# Patient Record
Sex: Female | Born: 1970 | Race: White | Hispanic: No | Smoking: Never smoker
Health system: Southern US, Community
[De-identification: ages and names within clinical notes are randomized; demographics above are authoritative.]

## PROBLEM LIST (undated history)

## (undated) DIAGNOSIS — F329 Major depressive disorder, single episode, unspecified: Secondary | ICD-10-CM

## (undated) DIAGNOSIS — F419 Anxiety disorder, unspecified: Secondary | ICD-10-CM

## (undated) DIAGNOSIS — F32A Depression, unspecified: Secondary | ICD-10-CM

## (undated) HISTORY — PX: ABDOMINAL HYSTERECTOMY: SHX81

## (undated) HISTORY — PX: CHOLECYSTECTOMY: SHX55

## (undated) HISTORY — PX: TUBAL LIGATION: SHX77

## (undated) HISTORY — PX: ABLATION ON ENDOMETRIOSIS: SHX5787

## (undated) HISTORY — PX: TONSILLECTOMY: SUR1361

## (undated) HISTORY — PX: LITHOTRIPSY: SUR834

---

## 2014-07-03 ENCOUNTER — Emergency Department (HOSPITAL_COMMUNITY)
Admission: EM | Admit: 2014-07-03 | Discharge: 2014-07-03 | Disposition: A | Discharge status: Home / Self Care | Payer: Medicaid Other | Attending: Emergency Medicine | Admitting: Emergency Medicine

## 2014-07-03 ENCOUNTER — Encounter (HOSPITAL_COMMUNITY): Payer: Self-pay | Admitting: Emergency Medicine

## 2014-07-03 DIAGNOSIS — Y929 Unspecified place or not applicable: Secondary | ICD-10-CM | POA: Insufficient documentation

## 2014-07-03 DIAGNOSIS — R21 Rash and other nonspecific skin eruption: Secondary | ICD-10-CM | POA: Insufficient documentation

## 2014-07-03 DIAGNOSIS — G8921 Chronic pain due to trauma: Secondary | ICD-10-CM | POA: Insufficient documentation

## 2014-07-03 DIAGNOSIS — M5430 Sciatica, unspecified side: Secondary | ICD-10-CM | POA: Insufficient documentation

## 2014-07-03 DIAGNOSIS — M62838 Other muscle spasm: Secondary | ICD-10-CM | POA: Insufficient documentation

## 2014-07-03 DIAGNOSIS — Z79899 Other long term (current) drug therapy: Secondary | ICD-10-CM | POA: Diagnosis not present

## 2014-07-03 DIAGNOSIS — M5442 Lumbago with sciatica, left side: Secondary | ICD-10-CM

## 2014-07-03 DIAGNOSIS — F419 Anxiety disorder, unspecified: Secondary | ICD-10-CM

## 2014-07-03 DIAGNOSIS — Y939 Activity, unspecified: Secondary | ICD-10-CM | POA: Insufficient documentation

## 2014-07-03 DIAGNOSIS — M62830 Muscle spasm of back: Secondary | ICD-10-CM

## 2014-07-03 DIAGNOSIS — W57XXXA Bitten or stung by nonvenomous insect and other nonvenomous arthropods, initial encounter: Secondary | ICD-10-CM

## 2014-07-03 DIAGNOSIS — F411 Generalized anxiety disorder: Secondary | ICD-10-CM | POA: Insufficient documentation

## 2014-07-03 HISTORY — DX: Anxiety disorder, unspecified: F41.9

## 2014-07-03 MED ORDER — NAPROXEN 500 MG PO TABS: 500.0000 mg | ORAL_TABLET | Freq: Two times a day (BID) | ORAL | Status: DC | PRN

## 2014-07-03 MED ORDER — HYDROCODONE-ACETAMINOPHEN 5-325 MG PO TABS: 1.0000 | ORAL_TABLET | Freq: Four times a day (QID) | ORAL | Status: AC | PRN

## 2014-07-03 MED ORDER — TRIAMCINOLONE ACETONIDE 0.1 % EX CREA: 1.0000 "application " | TOPICAL_CREAM | Freq: Three times a day (TID) | CUTANEOUS | Status: DC | PRN

## 2014-07-03 MED ORDER — ALPRAZOLAM 1 MG PO TABS: 1.0000 mg | ORAL_TABLET | Freq: Three times a day (TID) | ORAL | Status: DC | PRN

## 2014-07-03 NOTE — Discharge Instructions (Signed)
You have been given a small supply of xanax for the time being but you need to see a psychiatric or behavioral medicine doctor as soon as possible, using the list below to find a primary doctor. Your rash does not appear infected, use the triamcinolone cream as directed for itching. Your back pain should be treated with medicines such as ibuprofen or aleve and this back pain should get better over the next 2 weeks.  However if you develop severe or worsening pain, low back pain with fever, numbness, weakness or inability to walk or urinate, you should return to the ER immediately.  Please follow up with your doctor this week for a recheck if still having symptoms.  Low back pain is discomfort in the lower back that may be due to injuries to muscles and ligaments around the spine.  Occasionally, it may be caused by a a problem to a part of the spine called a disc.  The pain may last several days or a week;  However, most patients get completely well in 4 weeks.  Self - care:  The application of heat can help soothe the pain.  Maintaining your daily activities, including walking, is encourged, as it will help you get better faster than just staying in bed. Perform gentle stretching as discussed. Drink plenty of fluids.  Medications are also useful to help with pain control.  A commonly prescribed medications includes norco.  Do not drive or operate machinery while taking this medication.  Non steroidal anti inflammatory medications including Ibuprofen and naproxen;  These medications help both pain and swelling and are very useful in treating back pain.  They should be taken with food, as they can cause stomach upset, and more seriously, stomach bleeding.    SEEK IMMEDIATE MEDICAL ATTENTION IF: New numbness, tingling, weakness, or problem with the use of your arms or legs.  Severe back pain not relieved with medications.  Difficulty with or loss of control of your bowel or bladder control.  Increasing  pain in any areas of the body (such as chest or abdominal pain).  Shortness of breath, dizziness or fainting.  Nausea (feeling sick to your stomach), vomiting, fever, or sweats.  You will need to follow up with  Your primary healthcare provider in 1-2 weeks for reassessment.  If you do not have a doctor see the list below.  Back Pain, Adult Back pain is very common. The pain often gets better over time. The cause of back pain is usually not dangerous. Most people can learn to manage their back pain on their own.  HOME CARE   Stay active. Start with short walks on flat ground if you can. Try to walk farther each day.  Do not sit, drive, or stand in one place for more than 30 minutes. Do not stay in bed.  Do not avoid exercise or work. Activity can help your back heal faster.  Be careful when you bend or lift an object. Bend at your knees, keep the object close to you, and do not twist.  Sleep on a firm mattress. Lie on your side, and bend your knees. If you lie on your back, put a pillow under your knees.  Only take medicines as told by your doctor.  Put ice on the injured area.  Put ice in a plastic bag.  Place a towel between your skin and the bag.  Leave the ice on for 15-20 minutes, 03-04 times a day for the first 2  to 3 days. After that, you can switch between ice and heat packs.  Ask your doctor about back exercises or massage.  Avoid feeling anxious or stressed. Find good ways to deal with stress, such as exercise. GET HELP RIGHT AWAY IF:   Your pain does not go away with rest or medicine.  Your pain does not go away in 1 week.  You have new problems.  You do not feel well.  The pain spreads into your legs.  You cannot control when you poop (bowel movement) or pee (urinate).  Your arms or legs feel weak or lose feeling (numbness).  You feel sick to your stomach (nauseous) or throw up (vomit).  You have belly (abdominal) pain.  You feel like you may pass  out (faint). MAKE SURE YOU:   Understand these instructions.  Will watch your condition.  Will get help right away if you are not doing well or get worse. Document Released: 04/27/2008 Document Revised: 02/01/2012 Document Reviewed: 03/13/2014 [facility] Patient Information 2015 [place], [place]. This information is not intended to replace advice given to you by your health care provider. Make sure you discuss any questions you have with your health care provider.  Insect Bite Mosquitoes, flies, fleas, bedbugs, and other insects can bite. Insect bites are different from insect stings. The bite may be red, puffy (swollen), and itchy for 2 to 4 days. Most bites get better on their own. HOME CARE   Do not scratch the bite.  Keep the bite clean and dry. Wash the bite with soap and water.  Put ice on the bite.  Put ice in a plastic bag.  Place a towel between your skin and the bag.  Leave the ice on for 20 minutes, 4 times a day. Do this for the first 2 to 3 days, or as told by your doctor.  You may use medicated lotions or creams to lessen itching as told by your doctor.  Only take medicines as told by your doctor.  If you are given medicines (antibiotics), take them as told. Finish them even if you start to feel better. You may need a tetanus shot if:  You cannot remember when you had your last tetanus shot.  You have never had a tetanus shot.  The injury broke your skin. If you need a tetanus shot and you choose not to have one, you may get tetanus. Sickness from tetanus can be serious. GET HELP RIGHT AWAY IF:   You have more pain, redness, or puffiness.  You see a red line on the skin coming from the bite.  You have a fever.  You have joint pain.  You have a headache or neck pain.  You feel weak.  You have a rash.  You have chest pain, or you are short of breath.  You have belly (abdominal) pain.  You feel sick to your stomach (nauseous) or throw up  (vomit).  You feel very tired or sleepy. MAKE SURE YOU:   Understand these instructions.  Will watch your condition.  Will get help right away if you are not doing well or get worse. Document Released: 11/06/2000 Document Revised: 02/01/2012 Document Reviewed: 06/10/2011 [facility] Patient Information 2015 [place], [place]. This information is not intended to replace advice given to you by your health care provider. Make sure you discuss any questions you have with your health care provider.   Emergency Department Resource Guide 1) Find a Doctor and Pay Out of Pocket Although you won't  have to find out who is covered by your insurance plan, it is a good idea to ask around and get recommendations. You will then need to call the office and see if the doctor you have chosen will accept you as a new patient and what types of options they offer for patients who are self-pay. Some doctors offer discounts or will set up payment plans for their patients who do not have insurance, but you will need to ask so you aren't surprised when you get to your appointment.  2) Contact Your Local Health Department Not all health departments have doctors that can see patients for sick visits, but many do, so it is worth a call to see if yours does. If you don't know where your local health department is, you can check in your phone book. The CDC also has a tool to help you locate your state's health department, and many state websites also have listings of all of their local health departments.  3) Find a Walk-in Clinic If your illness is not likely to be very severe or complicated, you may want to try a walk in clinic. These are popping up all over the country in pharmacies, drugstores, and shopping centers. They're usually staffed by nurse practitioners or physician assistants that have been trained to treat common illnesses and complaints. They're usually fairly quick and inexpensive. However, if you have serious  medical issues or chronic medical problems, these are probably not your best option.  No Primary Care Doctor: - Call Health Connect at  [phone] - they can help you locate a primary care doctor that  accepts your insurance, provides certain services, etc. - Physician Referral Service- [phone]  Chronic Pain Problems: Organization         Address  Phone   Notes  [facility]  [phone] Patients need to be referred by their primary care doctor.   Medication Assistance: Organization         Address  Phone   Notes  [facility] Medication [facility] [address] [place] [place], [place] [address] [phone] --Must be a resident of [facility] -- Must have NO insurance coverage whatsoever (no Medicaid/ Medicare, etc.) -- The pt. MUST have a primary care doctor that directs their care regularly and follows them in the community   Med[facility]ist  [phone]   [employer]  [phone]    Agencies that provide inexpensive medical care: Organization         Address  Phone   Notes  [facility]  [phone]   [facility]    [phone]   [facility] [address] [place], [place] [address] [phone]   [facility] [address] [place] [place] [phone]   Planned Parenthood    [phone]   [facility]    [phone]   [facility]  [address], [place] Phone:  [phone], Fax:  [phone] Hours of Operation:  9 am - 6 pm, M-F.  Also accepts Medicaid/Medicare and self-pay.  [facility]  [address], [place] Phone: [phone], Fax: [phone]. Hours of Operation:  8:30 am - 5:30 pm, M-F.  Also accepts Medicaid and self-pay.  [facility] [place] [address] [employer] Phone: [phone]   [facility] [address] [place],  [place], [place] [phone], Ext. [phone] Mondays & Thursdays: 7-9 AM.  First 15 patients are seen on a first come, first serve basis.    Medicaid-accepting [facility] Providers:  Organization         Address  Phone   Notes  [facility] [address], [place] [phone] Also accepts self-pay patients.  [facility] [address], [place]  [phone]) [phone]   [facility] [address], [place] [phone]) [phone]   [facility] [address] [place] [address], [place] [phone]) [phone]   Renaye RakersVeita Bland [address], [place]   [phone]) [phone] Only accepts [place] Access [place] patients after they have their name applied to their card.   Self-Pay (no insurance) in [facility]:  Organization         Address  Phone   Notes  Sickle Cell Patients, [facility] [address], [place] [phone]) [phone]   [facility] [address], [place] [phone]   [facility] [place]  [address], [place] [phone]   [facility]/Dr. Osei-Bonsu  [address] [place] [address], [place] or [phone], [place] [phone] Phone number for both [place] and [place] locations is the same.  [facility] [address], [place] [phone]   [facility] [place] [address] [place] [address], [place] or [address] [phone]   [facility] [address], [place] [phone], phone; [phone]) [phone], fax Sees patients 1st and 3rd Saturday of every month.  Must not qualify for public or private insurance (i.e. Medicaid, Medicare, [place] Health Choice, Veterans' Benefits)  Household income should be no more than 200% of the poverty level The clinic cannot treat you if you are pregnant or think you are pregnant  Sexually  transmitted diseases are not treated at the clinic.    Dental Care: Organization         Address  Phone  Notes  [facility] Department of [facility] [address], [place] [phone] Accepts children up to age 42 who are enrolled in [place] or [place] Health Choice; pregnant women with a Medicaid card; and children who have applied for Medicaid or [place] Health Choice, but were declined, whose parents can pay a reduced fee at time of service.  [facility] of [facility] [place]  [address], [place] [phone] Accepts children up to age 42 who are enrolled in [place] or [place] Health Choice; pregnant women with a Medicaid card; and children who have applied for Medicaid or [place] Health Choice, but were declined, whose parents can pay a reduced fee at time of service.  [facility]  [address], [place] [phone] Patients are seen by appointment only. Walk-ins are not accepted. [facility] will see patients 42 years of age and older. Monday - Tuesday (8am-5pm) Most Wednesdays (8:30-5pm) $30 per visit, cash only  [facility]  [address], [facility] [phone] Patients are seen by appointment only. Walk-ins are not accepted. [facility] will see patients 42 years of age and older. One Wednesday Evening (Monthly: Volunteer Based).  $30 per visit, cash only  [employer]  Clinics  [phone] for adults; Children under age 69, call Graduate Pediatric Dentistry at [phone]. Children aged 13-14, please call [phone] to request a pediatric application.  Dental services are provided in all areas of dental care including fillings, crowns and bridges, complete and partial dentures, implants, gum treatment, root canals, and extractions. Preventive care is also provided. Treatment is provided to both adults and children. Patients are  selected via a lottery and there is often a waiting list.   [facility] [address] [place]  [phone] [email or web address]   [facility] [address] [place], [place] [phone], Ext. [phone] Second and Fourth Thursday of each month, opens at 6:30 AM; Clinic ends at 9 AM.  Patients are seen on a first-come first-served basis, and a limited number are seen during each clinic.   [facility]  [address] [place], [place] [phone]   Eligibility Requirements You must have lived in [place], [place], or [place] counties for at least the last three months.   You cannot be eligible for state or federal sponsored [place], including [employer], [place], or [employer].   You generally cannot be eligible for healthcare insurance through your employer.    How to apply: Eligibility screenings are held every Tuesday and Wednesday afternoon from 1:00 pm until 4:00 pm. You do not need an appointment for the interview!  [facility] [address] [place], [place] [phone]   [facility] Health Department  [phone]   [facility] Department  [phone]   [facility] Health Department  [phone]    Behavioral Health Resources in the Community: Intensive Outpatient Programs Organization         Address  Phone  Notes  [facility] [address], [place], [place] [phone]   [facility] Outpatient [address], [place], [place] [phone]   [facility] [address] [place], [place]  [phone]   [facility] Mental Health [address]  [place], [place] [phone] or [phone]   Substance Abuse Resources Organization         Address  Phone  Notes  [facility]  [phone]   [facility]  [phone]   [facility] [place]  [phone]   Floydene Flock  [phone]    Residential & Outpatient Substance Abuse Program  [phone]   Psychological Services Organization         Address  Phone  Notes  [facility]  [phone]   [facility]  [phone]   [facility] Mental Health [address] [place] [phone] or [phone]    Mobile Crisis Teams Organization         Address  Phone  Notes  [facility], Mobile Crisis Care Unit  [phone]   [facility]  [address] [place], [place] [phone]   Doristine Locks [address] [place] [phone]    Self-Help/Support Groups Organization         Address  Phone             Notes  [facility] [place] - variety of support groups  [phone]- [phone] Call for more information  Narcotics Anonymous (NA), [facility] [address], [employer] [place]  2 meetings at this location   [employer]  Phone  Notes  [facility] [address],    [place]  [phone]   [facility]  [address] [place] [address], [place], [place] [phone]   [facility] [address] [place], [place] [phone] Admissions: 8am-3pm M-F  [facility] [address],    [place], [place] [phone]   [facility] [address] [place], [place], [place] [phone]   [facility] [address],  [place], [place] [phone]   [facility] - Intensive Outpatient [address], [place], [place] [phone]   [facility] ([facility] [address] [place].,  [place], [place] [phone] or [phone]   [facility] ([facility]) [address] [place], [place] [phone] Accepts Medicaid  [facility] [place] [facility] [address],  [place] [phone] Substance Abuse/Addiction Treatment   [facility] Organization         Address  Phone  Notes  [facility]  [phone]   Angie Fava, PhD [address]vin Knack [place], [place]   [phone] or [phone]   [facility] Behavioral   [address] [place], [place] [phone]   [facility] [address] [place], [place] [phone] Insurance/Medicaid/sponsorship through [facility] [address]                                    [place], [place] [phone] Therapy/tele-psych/case  [facility] [address], [place] [phone]    Dr. Lolly Mustache  [phone]   [facility] [place]  [employer] [facility] Dept. 1) [address], [place] 2) [address] [place] 3)  [address], [place] [phone]  [phone]   [facility] Child Abuse Hotline [phone]) [phone] or [phone] (After Hours)

## 2014-07-03 NOTE — ED Notes (Addendum)
Pt [place], has anxiety and is out of xanax. Is requesting prescription for xanax and also red bug bites on her arms and legs. Also reports she fell last week and c/o lower back pain.
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
## 2014-07-05 NOTE — ED Provider Notes (Signed)
Medical screening examination/treatment/procedure(s) were performed by non-physician practitioner and as supervising physician I was immediately available for consultation/collaboration.   EKG Interpretation None        Samuel JesterKathleen Delmi Fulfer, DO 07/05/14 1555

## 2014-07-12 ENCOUNTER — Encounter (HOSPITAL_COMMUNITY): Payer: Self-pay | Admitting: Emergency Medicine

## 2014-07-12 ENCOUNTER — Emergency Department (HOSPITAL_COMMUNITY)
Admission: EM | Admit: 2014-07-12 | Discharge: 2014-07-12 | Disposition: A | Discharge status: Home / Self Care | Payer: Medicaid Other | Attending: Emergency Medicine | Admitting: Emergency Medicine

## 2014-07-12 ENCOUNTER — Ambulatory Visit (HOSPITAL_COMMUNITY)
Admission: RE | Admit: 2014-07-12 | Discharge: 2014-07-12 | Disposition: A | Discharge status: Home / Self Care | Payer: Medicaid Other | Attending: Psychiatry | Admitting: Psychiatry

## 2014-07-12 DIAGNOSIS — Z791 Long term (current) use of non-steroidal anti-inflammatories (NSAID): Secondary | ICD-10-CM | POA: Insufficient documentation

## 2014-07-12 DIAGNOSIS — Z90710 Acquired absence of both cervix and uterus: Secondary | ICD-10-CM | POA: Insufficient documentation

## 2014-07-12 DIAGNOSIS — IMO0002 Reserved for concepts with insufficient information to code with codable children: Secondary | ICD-10-CM | POA: Insufficient documentation

## 2014-07-12 DIAGNOSIS — F411 Generalized anxiety disorder: Secondary | ICD-10-CM | POA: Insufficient documentation

## 2014-07-12 DIAGNOSIS — F419 Anxiety disorder, unspecified: Secondary | ICD-10-CM

## 2014-07-12 DIAGNOSIS — Z79899 Other long term (current) drug therapy: Secondary | ICD-10-CM | POA: Diagnosis not present

## 2014-07-12 HISTORY — DX: Major depressive disorder, single episode, unspecified: F32.9

## 2014-07-12 HISTORY — DX: Depression, unspecified: F32.A

## 2014-07-12 MED ORDER — ALPRAZOLAM 0.5 MG PO TABS
1.0000 mg | ORAL_TABLET | Freq: Once | ORAL | Status: AC
  Administered 2014-07-12: 1 mg via ORAL
  Filled 2014-07-12: qty 2

## 2014-07-12 NOTE — ED Notes (Signed)
[facility] for follow [facility]

## 2014-07-12 NOTE — BH Assessment (Addendum)
[facility] be [facility]. AC Thurman Coyerric Kaplan was notified.   [facility] for same complaint requesting medication. It is noted that patient was informed that pt's are [facility] and than [facility]. Pt was d/c from ER earlier today with the recommendation to F/U with her psychiatrist.  Glorious PeachNajah Mecca Guitron, MS, LCASA Assessment Counselor

## 2014-07-12 NOTE — ED Notes (Addendum)
Pt c/o increased anxiety, possible panic attack over last few day. Pt has been without her Xanax for 3 weeks.Pt stated that she is new to the area and called a local psychiatric that referred to to the ED for her acute concerns, (racing thoughts). Denies SI/HI. Pt remains alert, oriented and appropriate. Son at bedside.  1720 Corrected-pt stated that she received a prescription on 8/11

## 2014-07-12 NOTE — ED Provider Notes (Signed)
CSN: [id number]     Arrival date & time 07/12/14  1605 History  This chart was scribed for Claudia SitesLisa Jaziel Bennett, PA, working with Toy BakerAnthony T Allen, MD found by Elon SpannerGarrett Cook, ED Scribe. This patient was seen in room WTR5/WTR5 and the patient's care was started at 4:44 PM.   Chief Complaint  Patient presents with  . Panic Attack    pt reports anxiety increased over last 4 days   The history is provided by the patient. No language interpreter was used.    HPI Comments: Claudia MacleodJennifer Bradley is a 43 y.o. female with a history of anxiety who presents to the Emergency Department complaining of an anxiety attack onset today after recent stressors.  States she is experiencing racing thoughts.  Denies chest pain, SOB, palpitations, dizziness, weakness.  The patient states she has had several recent stressors [place], trouble finding a place to live, finding employment, and finding a a school for her son to attend.  She states she was trying to get an appointment with a psychiatrist today and has one scheduled for next Wednesday.  Patient states she was seen in the ED on 8/11 for a fall and was given 1 mg x 30 Xanax.  She reports she had previously been prescribed 2 mg TID.  Patient denies SI, HI, auditory/visual hallucinations.   Per chart review [facility] multiple complaints including insect bites, L lower back pain/spasms secondary to a fall, and medication refill.  Relevant to today's complaint, she was prescribed 1 mg x 30 Xanax TID.    Past Medical History  Diagnosis Date  . Anxiety    Past Surgical History  Procedure Laterality Date  . Abdominal hysterectomy    . Tubal ligation    . Cholecystectomy    . Tonsillectomy    . Lithotripsy    . Ablation on endometriosis     No family history on file. History  Substance Use Topics  . Smoking status: Never Smoker   . Smokeless tobacco: Not on file  . Alcohol Use: No   OB History   Grav Para Term Preterm  Abortions TAB SAB Ect Mult Living                 Review of Systems  Psychiatric/Behavioral: The patient is nervous/anxious.   All other systems reviewed and are negative.     Allergies  Review of patient's allergies indicates no known allergies.  Home Medications   Prior to Admission medications   Medication Sig Start Date End Date Taking? Authorizing Provider  ALPRAZolam Prudy Feeler(XANAX) 1 MG tablet Take 1 tablet (1 mg total) by mouth 3 (three) times daily as needed for anxiety. 07/03/14   Mercedes Strupp Camprubi-Soms, PA-C  alprazolam Prudy Feeler(XANAX) 2 MG tablet Take 2 mg by mouth 4 (four) times daily as needed for anxiety.    Historical Provider, MD  HYDROcodone-acetaminophen (NORCO) 5-325 MG per tablet Take 1-2 tablets by mouth every 6 (six) hours as needed for severe pain. 07/03/14   Mercedes Strupp Camprubi-Soms, PA-C  naproxen (NAPROSYN) 500 MG tablet Take 1 tablet (500 mg total) by mouth 2 (two) times daily as needed for mild pain, moderate pain or headache (TAKE WITH MEALS.). 07/03/14   Mercedes Strupp Camprubi-Soms, PA-C  triamcinolone cream (KENALOG) 0.1 % Apply 1 application topically 3 (three) times daily as needed. 07/03/14   Mercedes Strupp Camprubi-Soms, PA-C   BP 105/75  Pulse 89  Temp(Src) 97.8 F (36.6 C) (  Oral)  Resp 18  Wt 164 lb (74.39 kg)  SpO2 97%  Physical Exam  Nursing note and vitals reviewed. Constitutional: She is oriented to person, place, and time. She appears well-developed and well-nourished.  HENT:  Head: Normocephalic and atraumatic.  Mouth/Throat: Oropharynx is clear and moist.  Eyes: Conjunctivae and EOM are normal. Pupils are equal, round, and reactive to light.  Pupils dilated but reactive  Neck: Normal range of motion.  Cardiovascular: Normal rate, regular rhythm and normal heart sounds.   Pulmonary/Chest: Effort normal and breath sounds normal.  Abdominal: Soft. Bowel sounds are normal.  Musculoskeletal: Normal range of motion.  Neurological: She is  alert and oriented to person, place, and time.  Skin: Skin is warm and dry.  Psychiatric: She has a normal mood and affect. Her speech is normal. She is not actively hallucinating. She expresses no homicidal and no suicidal ideation. She expresses no suicidal plans and no homicidal plans.    ED Course  Procedures (including critical care time)  DIAGNOSTIC STUDIE97S: Oxygen Saturation is 97% on RA, normal by my interpretation.    COORDINATION OF CARE:  4:49 PM Discussed treatment plan with patient at bedside.  Patient acknowledges and agrees with plan.    Labs Review Labs Reviewed - No data to display  Imaging Review No results found.   EKG Interpretation None      MDM   Final diagnoses:  Anxiety   43 year old female with complaint of anxiety. She denies any suicidal or homicidal ideation. She denies any auditory or visual hallucinations. She was seen at [facility] emergency Department 11 days ago for the same with a refill of her Xanax. She reports she has an appointment with a psychiatrist next week. Patient is calm and cooperative and does not currently appear to be a danger to herself or others.  She has no clinical signs of withdrawal at this time.  She was given a dose of her medication here in the ED, but i have not refilled her medication as i feel this needs to be closely monitored by a PCP/psychiatrist.  She was given a psychiatry resource guide and encouraged to call her psychiatrist for earlier appt if she felt it was needed.  Discussed plan with patient, he/she acknowledged understanding and agreed with plan of care.  Return precautions given for new or worsening symptoms.  I personally performed the services described in this documentation, which was scribed in my presence. The recorded information has been reviewed and is accurate.  Garlon Hatchet, PA-C 07/12/14 1811

## 2014-07-12 NOTE — ED Notes (Signed)
Post discharge. Pt requesting prescription medications. PA advised pt that they need to be prescribed by her psychiatrist. [facility]. Pt was informed that [facility] and [facility].

## 2014-07-12 NOTE — ED Notes (Signed)
PER EMS- Medic 61 Pt called EMS for transport to ED. Pt requesting refill an antianxiety medications. Pt alert, oriented and appropriate

## 2014-07-12 NOTE — ED Provider Notes (Signed)
Medical screening examination/treatment/procedure(s) were performed by non-physician practitioner and as supervising physician I was immediately available for consultation/collaboration.   Uri Turnbough T Zayn Selley, MD 07/12/14 2247 

## 2014-07-12 NOTE — Discharge Instructions (Signed)
Follow-up with psychiatry on Wednesday as scheduled. Return to the ED for new concerns.

## 2014-07-14 ENCOUNTER — Emergency Department (HOSPITAL_COMMUNITY): Payer: Medicaid Other

## 2014-07-14 ENCOUNTER — Encounter (HOSPITAL_COMMUNITY): Payer: Self-pay | Admitting: Emergency Medicine

## 2014-07-14 ENCOUNTER — Emergency Department (HOSPITAL_COMMUNITY)
Admission: EM | Admit: 2014-07-14 | Discharge: 2014-07-14 | Disposition: A | Discharge status: Home / Self Care | Payer: Medicaid Other | Attending: Emergency Medicine | Admitting: Emergency Medicine

## 2014-07-14 DIAGNOSIS — IMO0002 Reserved for concepts with insufficient information to code with codable children: Secondary | ICD-10-CM | POA: Diagnosis not present

## 2014-07-14 DIAGNOSIS — R296 Repeated falls: Secondary | ICD-10-CM | POA: Insufficient documentation

## 2014-07-14 DIAGNOSIS — F411 Generalized anxiety disorder: Secondary | ICD-10-CM | POA: Insufficient documentation

## 2014-07-14 DIAGNOSIS — Y92410 Unspecified street and highway as the place of occurrence of the external cause: Secondary | ICD-10-CM | POA: Diagnosis not present

## 2014-07-14 DIAGNOSIS — Y9389 Activity, other specified: Secondary | ICD-10-CM | POA: Diagnosis not present

## 2014-07-14 DIAGNOSIS — F329 Major depressive disorder, single episode, unspecified: Secondary | ICD-10-CM | POA: Diagnosis not present

## 2014-07-14 DIAGNOSIS — F3289 Other specified depressive episodes: Secondary | ICD-10-CM | POA: Diagnosis not present

## 2014-07-14 DIAGNOSIS — R21 Rash and other nonspecific skin eruption: Secondary | ICD-10-CM | POA: Diagnosis not present

## 2014-07-14 DIAGNOSIS — S39012A Strain of muscle, fascia and tendon of lower back, initial encounter: Secondary | ICD-10-CM

## 2014-07-14 MED ORDER — HYDROMORPHONE HCL PF 1 MG/ML IJ SOLN
1.0000 mg | Freq: Once | INTRAMUSCULAR | Status: AC
  Administered 2014-07-14: 1 mg via INTRAMUSCULAR
  Filled 2014-07-14: qty 1

## 2014-07-14 MED ORDER — OXYCODONE-ACETAMINOPHEN 5-325 MG PO TABS: 1.0000 | ORAL_TABLET | Freq: Four times a day (QID) | ORAL | Status: DC | PRN

## 2014-07-14 MED ORDER — PERMETHRIN 5 % EX CREA: TOPICAL_CREAM | CUTANEOUS | Status: DC
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
## 2014-07-14 NOTE — ED Notes (Signed)
Pt states that she fell and injured her back.

## 2014-07-14 NOTE — ED Provider Notes (Signed)
CSN: [id number]     Arrival date & time 07/14/14  1145 History   First MD Initiated Contact with Patient 07/14/14 1308     Chief Complaint  Patient presents with  . Flank Pain  . Hip Pain     (Consider location/radiation/quality/duration/timing/severity/associated sxs/prior Treatment) Patient is a 43 y.o. female presenting with hip pain and fall. The history is provided by the patient.  Hip Pain  Fall This is a new problem. The current episode started less than 1 hour ago. Episode frequency: once. The problem has been resolved. The symptoms are aggravated by walking. Nothing relieves the symptoms. She has tried nothing for the symptoms. The treatment provided no relief.    Past Medical History  Diagnosis Date  . Anxiety   . Anxiety   . Depression    Past Surgical History  Procedure Laterality Date  . Abdominal hysterectomy    . Tubal ligation    . Cholecystectomy    . Tonsillectomy    . Lithotripsy    . Ablation on endometriosis     Family History  Problem Relation Age of Onset  . Hypertension Other    History  Substance Use Topics  . Smoking status: Never Smoker   . Smokeless tobacco: Not on file  . Alcohol Use: No   OB History   Grav Para Term Preterm Abortions TAB SAB Ect Mult Living                 Review of Systems  Constitutional: Negative for fever and fatigue.  HENT: Negative for congestion and drooling.   Eyes: Negative for pain.  Respiratory: Negative for cough.   Gastrointestinal: Negative for nausea, vomiting and diarrhea.  Genitourinary: Negative for dysuria and hematuria.  Musculoskeletal: Negative for back pain, gait problem and neck pain.  Skin: Negative for color change.  Neurological: Negative for dizziness.  Hematological: Negative for adenopathy.  Psychiatric/Behavioral: Negative for behavioral problems.  All other systems reviewed and are negative.     Allergies  Review of patient's allergies indicates no known allergies.  Home  Medications   Prior to Admission medications   Medication Sig Start Date End Date Taking? Authorizing Provider  ALPRAZolam Prudy Feeler(XANAX) 1 MG tablet Take 1 tablet (1 mg total) by mouth 3 (three) times daily as needed for anxiety. 07/03/14  Yes Mercedes Strupp Camprubi-Soms, PA-C  HYDROcodone-acetaminophen (NORCO) 5-325 MG per tablet Take 1-2 tablets by mouth every 6 (six) hours as needed for severe pain. 07/03/14  Yes Mercedes Strupp Camprubi-Soms, PA-C  naproxen (NAPROSYN) 500 MG tablet Take 1 tablet (500 mg total) by mouth 2 (two) times daily as needed for mild pain, moderate pain or headache (TAKE WITH MEALS.). 07/03/14  Yes Mercedes Strupp Camprubi-Soms, PA-C  triamcinolone cream (KENALOG) 0.1 % Apply 1 application topically 3 (three) times daily as needed (skin irritation).   Yes Historical Provider, MD   BP 98/78  Pulse 89  Temp(Src) 98.7 F (37.1 C) (Oral)  Resp 18  SpO2 99% Physical Exam  Nursing note and vitals reviewed. Constitutional: She is oriented to person, place, and time. She appears well-developed and well-nourished.  HENT:  Head: Normocephalic and atraumatic.  Mouth/Throat: Oropharynx is clear and moist. No oropharyngeal exudate.    Eyes: Conjunctivae and EOM are normal. Pupils are equal, round, and reactive to light.  Neck: Normal range of motion. Neck supple.  Cardiovascular: Normal rate, regular rhythm, normal heart sounds and intact distal pulses.  Exam reveals no gallop and no friction rub.  No murmur heard. Pulmonary/Chest: Effort normal and breath sounds normal. No respiratory distress. She has no wheezes.  Abdominal: Soft. Bowel sounds are normal. There is no tenderness. There is no rebound and no guarding.  Musculoskeletal: Normal range of motion. She exhibits no edema and no tenderness.  Normal strength and sensation in bilateral lower extremities.  2+ distal pulses in bilateral lower extremities.  Mild tenderness to palpation of the lower mid lumbar area. Also  the right paraspinal lumbar area and right lateral hip.  Neurological: She is alert and oriented to person, place, and time.  Skin: Skin is warm and dry.  Excoriated lesions noted on the extremities.   Psychiatric: She has a normal mood and affect. Her behavior is normal.    ED Course  Procedures (including critical care time) Labs Review Labs Reviewed - No data to display  Imaging Review Dg Lumbar Spine Complete  07/14/2014   CLINICAL DATA:  Low back pain after fall.  EXAM: LUMBAR SPINE - COMPLETE 4+ VIEW  COMPARISON:  None.  FINDINGS: There is no evidence of lumbar spine fracture. Alignment is normal. Intervertebral disc spaces are maintained.  IMPRESSION: Normal lumbar spine.   Electronically Signed   By: Roque Lias M.D.   On: 07/14/2014 14:23   Dg Hip Complete Right  07/14/2014   CLINICAL DATA:  Pain post trauma  EXAM: RIGHT HIP - COMPLETE 2+ VIEW  COMPARISON:  None.  FINDINGS: Frontal pelvis as well as frontal and lateral right hip images were obtained. There is no fracture or dislocation. Joint spaces appear intact. No erosive change.  IMPRESSION: No abnormality noted.   Electronically Signed   By: Bretta Bang M.D.   On: 07/14/2014 14:21     EKG Interpretation None      MDM   Final diagnoses:  Back strain, initial encounter  Rash    3:17 PM 43 y.o. female with a history of chronic low back pain who presents with a fall. She states that while she was walking her right leg gave out and she fell down an embankment onto her right side. She denies hitting her head or loss of consciousness. She is afebrile and vital signs are unremarkable here. She has some right lumbar paraspinal tenderness to palpation which radiates to her right hip. She has no evidence of obvious traumatic injury on exam. Does think she fractured one of her teeth. Left upper 2nd molar appears to have a mild ellis 1 fx.  Will get screening imaging and pain control. The patient notes she does have a history  of her right leg giving out in the past. Normal strength and sensation in bilateral lower extremities on my exam.  The patient was also incidentally found to have excoriated lesions on her extremities. Her son who is with her has similar lesions. She states they are currently living in an RV. Suspicious for bedbugs versus scabies. Will treat empirically with permethrin.  3:48 PM: I interpreted/reviewed the labs and/or imaging which were non-contributory.  Pt is ambulatory.  I have discussed the diagnosis/risks/treatment options with the patient and believe the pt to be eligible for discharge home to follow-up with her dentist/pcp. We also discussed returning to the ED immediately if new or worsening sx occur. We discussed the sx which are most concerning (e.g., worsening pain, fever) that necessitate immediate return. Medications administered to the patient during their visit and any new prescriptions provided to the patient are listed below.  Medications given during this visit Medications  HYDROmorphone (DILAUDID) injection 1 mg (1 mg Intramuscular Given 07/14/14 1323)    New Prescriptions   OXYCODONE-ACETAMINOPHEN (PERCOCET) 5-325 MG PER TABLET    Take 1 tablet by mouth every 6 (six) hours as needed for moderate pain.   PERMETHRIN (ELIMITE) 5 % CREAM    Apply cream from head to toe; leave on for 8-14 hours before washing off with water; may reapply in 1 week if live mites appear.     Purvis Sheffield, MD 07/15/14 [phone]

## 2014-07-28 ENCOUNTER — Emergency Department (HOSPITAL_COMMUNITY)
Admission: EM | Admit: 2014-07-28 | Discharge: 2014-07-28 | Disposition: A | Discharge status: Home / Self Care | Payer: Medicaid Other | Attending: Emergency Medicine | Admitting: Emergency Medicine

## 2014-07-28 ENCOUNTER — Encounter (HOSPITAL_COMMUNITY): Payer: Self-pay | Admitting: Emergency Medicine

## 2014-07-28 ENCOUNTER — Emergency Department (HOSPITAL_COMMUNITY): Payer: Medicaid Other

## 2014-07-28 DIAGNOSIS — Z3202 Encounter for pregnancy test, result negative: Secondary | ICD-10-CM | POA: Diagnosis not present

## 2014-07-28 DIAGNOSIS — Z9851 Tubal ligation status: Secondary | ICD-10-CM | POA: Insufficient documentation

## 2014-07-28 DIAGNOSIS — F3289 Other specified depressive episodes: Secondary | ICD-10-CM | POA: Diagnosis not present

## 2014-07-28 DIAGNOSIS — Z9889 Other specified postprocedural states: Secondary | ICD-10-CM | POA: Insufficient documentation

## 2014-07-28 DIAGNOSIS — F411 Generalized anxiety disorder: Secondary | ICD-10-CM | POA: Insufficient documentation

## 2014-07-28 DIAGNOSIS — Z79899 Other long term (current) drug therapy: Secondary | ICD-10-CM | POA: Diagnosis not present

## 2014-07-28 DIAGNOSIS — Z90710 Acquired absence of both cervix and uterus: Secondary | ICD-10-CM | POA: Insufficient documentation

## 2014-07-28 DIAGNOSIS — R319 Hematuria, unspecified: Secondary | ICD-10-CM | POA: Diagnosis not present

## 2014-07-28 DIAGNOSIS — R112 Nausea with vomiting, unspecified: Secondary | ICD-10-CM | POA: Insufficient documentation

## 2014-07-28 DIAGNOSIS — Z9089 Acquired absence of other organs: Secondary | ICD-10-CM | POA: Insufficient documentation

## 2014-07-28 DIAGNOSIS — Z87442 Personal history of urinary calculi: Secondary | ICD-10-CM | POA: Insufficient documentation

## 2014-07-28 DIAGNOSIS — F329 Major depressive disorder, single episode, unspecified: Secondary | ICD-10-CM | POA: Diagnosis not present

## 2014-07-28 LAB — BASIC METABOLIC PANEL
Anion gap: 13 (ref 5–15)
BUN: 15 mg/dL (ref 6–23)
CALCIUM: 9.3 mg/dL (ref 8.4–10.5)
CO2: 24 mEq/L (ref 19–32)
CREATININE: 0.81 mg/dL (ref 0.50–1.10)
Chloride: 105 mEq/L (ref 96–112)
GFR calc Af Amer: >90 mL/min (ref >90)
GFR calc non Af Amer: 88 mL/min — ABNORMAL LOW (ref >90)
GLUCOSE: 92 mg/dL (ref 70–99)
Potassium: 3.7 mEq/L (ref 3.7–5.3)
Sodium: 142 mEq/L (ref 137–147)

## 2014-07-28 LAB — CBC WITH DIFFERENTIAL/PLATELET
Basophils Absolute: 0.1 10*3/uL (ref 0.0–0.1)
Basophils Relative: 2 % — ABNORMAL HIGH (ref 0–1)
EOS ABS: 0.2 10*3/uL (ref 0.0–0.7)
EOS PCT: 3 % (ref 0–5)
HCT: 37 % (ref 36.0–46.0)
Hemoglobin: 12.6 g/dL (ref 12.0–15.0)
LYMPHS ABS: 2.4 10*3/uL (ref 0.7–4.0)
Lymphocytes Relative: 43 % (ref 12–46)
MCH: 29.2 pg (ref 26.0–34.0)
MCHC: 34.1 g/dL (ref 30.0–36.0)
MCV: 85.6 fL (ref 78.0–100.0)
Monocytes Absolute: 0.5 10*3/uL (ref 0.1–1.0)
Monocytes Relative: 9 % (ref 3–12)
Neutro Abs: 2.4 10*3/uL (ref 1.7–7.7)
Neutrophils Relative %: 43 % (ref 43–77)
Platelets: 259 10*3/uL (ref 150–400)
RBC: 4.32 MIL/uL (ref 3.87–5.11)
RDW: 12.9 % (ref 11.5–15.5)
WBC: 5.4 10*3/uL (ref 4.0–10.5)

## 2014-07-28 LAB — URINE MICROSCOPIC-ADD ON

## 2014-07-28 LAB — URINALYSIS, ROUTINE W REFLEX MICROSCOPIC
Bilirubin Urine: NEGATIVE
Glucose, UA: NEGATIVE mg/dL
Ketones, ur: NEGATIVE mg/dL
NITRITE: NEGATIVE
Protein, ur: NEGATIVE mg/dL
Specific Gravity, Urine: 1.016 (ref 1.005–1.030)
Urobilinogen, UA: 0.2 mg/dL (ref 0.0–1.0)
pH: 7 (ref 5.0–8.0)

## 2014-07-28 LAB — PREGNANCY, URINE: Preg Test, Ur: NEGATIVE

## 2014-07-28 MED ORDER — PHENAZOPYRIDINE HCL 95 MG PO TABS: 95.0000 mg | ORAL_TABLET | Freq: Three times a day (TID) | ORAL | Status: AC | PRN

## 2014-07-28 MED ORDER — HYDROMORPHONE HCL PF 1 MG/ML IJ SOLN
1.0000 mg | Freq: Once | INTRAMUSCULAR | Status: AC
  Administered 2014-07-28: 1 mg via INTRAVENOUS
  Filled 2014-07-28: qty 1

## 2014-07-28 MED ORDER — HYDROCODONE-ACETAMINOPHEN 5-325 MG PO TABS: 1.0000 | ORAL_TABLET | ORAL | Status: AC | PRN

## 2014-07-28 MED ORDER — SODIUM CHLORIDE 0.9 % IV BOLUS (SEPSIS)
1000.0000 mL | Freq: Once | INTRAVENOUS | Status: AC
  Administered 2014-07-28: 1000 mL via INTRAVENOUS

## 2014-07-28 MED ORDER — ONDANSETRON HCL 4 MG/2ML IJ SOLN
4.0000 mg | Freq: Once | INTRAMUSCULAR | Status: AC
  Administered 2014-07-28: 4 mg via INTRAVENOUS
  Filled 2014-07-28: qty 2

## 2014-07-28 MED ORDER — SULFAMETHOXAZOLE-TRIMETHOPRIM 800-160 MG PO TABS: 1.0000 | ORAL_TABLET | Freq: Two times a day (BID) | ORAL | Status: AC

## 2014-07-28 NOTE — ED Provider Notes (Signed)
CSN: [id number]     Arrival date & time 07/28/14  1418 History   First MD Initiated Contact with Patient 07/28/14 1733     Chief Complaint  Patient presents with  . Hematuria     (Consider location/radiation/quality/duration/timing/severity/associated sxs/prior Treatment) HPI Comments: The patient is a 43 year old female past no history of renal calculi presents emergency room chief complaint of hematuria ongoing for one week.  The patient reports increase in left flank pain over the last one month. Denies aggravating or relieving factors. She reports similar symptoms in the past with history of renal stones.  Last stone 2007 in [place]. She reports nausea without emesis.  He reports history of a complete hysterectomy. No abnormal vaginal discharge. Reports taking Norco and Percocet without full resolution of symptoms.  The history is provided by the patient. No language interpreter was used.    Past Medical History  Diagnosis Date  . Anxiety   . Anxiety   . Depression    Past Surgical History  Procedure Laterality Date  . Abdominal hysterectomy    . Tubal ligation    . Cholecystectomy    . Tonsillectomy    . Lithotripsy    . Ablation on endometriosis     Family History  Problem Relation Age of Onset  . Hypertension Other    History  Substance Use Topics  . Smoking status: Never Smoker   . Smokeless tobacco: Not on file  . Alcohol Use: No   OB History   Grav Para Term Preterm Abortions TAB SAB Ect Mult Living                 Review of Systems  Constitutional: Negative for fever and chills.  Gastrointestinal: Positive for nausea and abdominal pain. Negative for vomiting, diarrhea, constipation and blood in stool.  Genitourinary: Positive for hematuria and flank pain. Negative for vaginal discharge.      Allergies  Ciprofloxacin  Home Medications   Prior to Admission medications   Medication Sig Start Date End Date Taking? Authorizing Provider  ALPRAZolam Prudy Feeler)  1 MG tablet Take 1 mg by mouth 4 (four) times daily - after meals and at bedtime.   Yes Historical Provider, MD  amphetamine-dextroamphetamine (ADDERALL XR) 20 MG 24 hr capsule Take 20 mg by mouth every morning.   Yes Historical Provider, MD  HYDROcodone-acetaminophen (NORCO) 5-325 MG per tablet Take 1-2 tablets by mouth every 6 (six) hours as needed for severe pain. 07/03/14  Yes Mercedes Strupp Camprubi-Soms, PA-C   BP 132/85  Pulse 87  Temp(Src) 98.6 F (37 C) (Oral)  Resp 18  Ht  (1.651 m)  Wt 150 lb (68.04 kg)  BMI 24.96 kg/m2  SpO2 100% Physical Exam  Nursing note and vitals reviewed. Constitutional: She is oriented to person, place, and time. She appears well-developed and well-nourished.  Non-toxic appearance. She does not have a sickly appearance. She does not appear ill. No distress.  HENT:  Head: Normocephalic and atraumatic.  Eyes: EOM are normal.  Neck: Neck supple.  Cardiovascular: Normal rate.   Not tachycardic on exam  Pulmonary/Chest: Effort normal and breath sounds normal. No respiratory distress. She has no wheezes.  Abdominal: Soft. She exhibits no distension. There is tenderness in the left upper quadrant. There is CVA tenderness. There is no rebound and no guarding.  Moderate left CVA tenderness  Neurological: She is alert and oriented to person, place, and time.  Skin: Skin is warm and dry. She is not diaphoretic.  Psychiatric: She has a normal mood and affect.    ED Course  Procedures (including critical care time) Labs Review Results for orders placed during the hospital encounter of 07/28/14  URINALYSIS, ROUTINE W REFLEX MICROSCOPIC      Result Value Ref Range   Color, Urine AMBER (*) YELLOW   APPearance CLOUDY (*) CLEAR   Specific Gravity, Urine 1.016  1.005 - 1.030   pH 7.0  5.0 - 8.0   Glucose, UA NEGATIVE  NEGATIVE mg/dL   Hgb urine dipstick MODERATE (*) NEGATIVE   Bilirubin Urine NEGATIVE  NEGATIVE   Ketones, ur NEGATIVE  NEGATIVE mg/dL    Protein, ur NEGATIVE  NEGATIVE mg/dL   Urobilinogen, UA 0.2  0.0 - 1.0 mg/dL   Nitrite NEGATIVE  NEGATIVE   Leukocytes, UA TRACE (*) NEGATIVE  URINE MICROSCOPIC-ADD ON      Result Value Ref Range   Squamous Epithelial / LPF FEW (*) RARE   WBC, UA 3-6  <3 WBC/hpf   RBC / HPF 21-50  <3 RBC/hpf   Bacteria, UA FEW (*) RARE  PREGNANCY, URINE      Result Value Ref Range   Preg Test, Ur NEGATIVE  NEGATIVE  CBC WITH DIFFERENTIAL      Result Value Ref Range   WBC 5.4  4.0 - 10.5 K/uL   RBC 4.32  3.87 - 5.11 MIL/uL   Hemoglobin 12.6  12.0 - 15.0 g/dL   HCT 16.1  09.6 - 04.5 %   MCV 85.6  78.0 - 100.0 fL   MCH 29.2  26.0 - 34.0 pg   MCHC 34.1  30.0 - 36.0 g/dL   RDW 40.9  81.1 - 91.4 %   Platelets 259  150 - 400 K/uL   Neutrophils Relative % 43  43 - 77 %   Neutro Abs 2.4  1.7 - 7.7 K/uL   Lymphocytes Relative 43  12 - 46 %   Lymphs Abs 2.4  0.7 - 4.0 K/uL   Monocytes Relative 9  3 - 12 %   Monocytes Absolute 0.5  0.1 - 1.0 K/uL   Eosinophils Relative 3  0 - 5 %   Eosinophils Absolute 0.2  0.0 - 0.7 K/uL   Basophils Relative 2 (*) 0 - 1 %   Basophils Absolute 0.1  0.0 - 0.1 K/uL  BASIC METABOLIC PANEL      Result Value Ref Range   Sodium 142  137 - 147 mEq/L   Potassium 3.7  3.7 - 5.3 mEq/L   Chloride 105  96 - 112 mEq/L   CO2 24  19 - 32 mEq/L   Glucose, Bld 92  70 - 99 mg/dL   BUN 15  6 - 23 mg/dL   Creatinine, Ser 7.82  0.50 - 1.10 mg/dL   Calcium 9.3  8.4 - 95.6 mg/dL   GFR calc non Af Amer 88 (*) >90 mL/min   GFR calc Af Amer >90  >90 mL/min   Anion gap 13  5 - 15     Ct Renal Stone Study  07/28/2014   CLINICAL DATA:  Left flank pain.  Hematuria.  Frequency urinating.  EXAM: CT RENAL STONE PROTOCOL  TECHNIQUE: Multidetector CT imaging of the abdomen and pelvis was performed following the standard protocol without intravenous contrast  COMPARISON:  07/22/2014  FINDINGS: The lung bases are clear.  A nonobstructing stone is again demonstrated in the midportion of the left  kidney adjacent to a focal area of parenchymal scarring.  No additional renal or ureteral calcifications are demonstrated. No bladder stone or bladder wall thickening. No pyelocaliectasis or ureterectasis.  The unenhanced appearance of the liver, spleen, pancreas, adrenal glands, abdominal aorta, inferior vena cava, and retroperitoneal lymph nodes is unremarkable. Surgical absence of the gallbladder. Small accessory spleens. Stomach and small bowel are decompressed. Stool-filled colon without distention. No free air or free fluid in the abdomen.  Pelvis: Appendix is normal. No evidence of diverticulitis. Bladder wall is not thickened. No pelvic mass or lymphadenopathy. No free or loculated pelvic fluid collections. No destructive bone lesions.  IMPRESSION: Nonobstructing stone in the midportion left kidney is unchanged. No ureteral stone or obstruction demonstrated.   Electronically Signed   By: Burman Nieves M.D.   On: 07/28/2014 21:17    Imaging Review No results found.   EKG Interpretation None      MDM   Final diagnoses:  Hematuria   Patient presents with hematuria reports history of renal calculi and similar complaints. CT without obstructing stone. UA shows blood, trace leuks 3-6 whites few bacteria few squamous urine culture sent. Will treat with Pyridium, Bactrim and pain medication. Urology referral given.  9:45 PM Patient resting comfortably in room. Discussed lab results, imaging results, and treatment plan with the patient. Return precautions given. Reports understanding and no other concerns at this time.  Patient is stable for discharge at this time. Patient is requesting crackers and drink at this time.  Meds given in ED:  Medications  sodium chloride 0.9 % bolus 1,000 mL (0 mLs Intravenous Stopped 07/28/14 1906)  ondansetron (ZOFRAN) injection 4 mg (4 mg Intravenous Given 07/28/14 1827)  HYDROmorphone (DILAUDID) injection 1 mg (1 mg Intravenous Given 07/28/14 1828)     Discharge Medication List as of 07/28/2014  9:39 PM    START taking these medications   Details  !! HYDROcodone-acetaminophen (NORCO/VICODIN) 5-325 MG per tablet Take 1 tablet by mouth every 4 (four) hours as needed for moderate pain or severe pain., Starting 07/28/2014, Until Discontinued, Print    phenazopyridine (PYRIDIUM) 95 MG tablet Take 1 tablet (95 mg total) by mouth 3 (three) times daily as needed for pain., Starting 07/28/2014, Until Discontinued, Print    sulfamethoxazole-trimethoprim (BACTRIM DS,SEPTRA DS) 800-160 MG per tablet Take 1 tablet by mouth 2 (two) times daily., Starting 07/28/2014, Last dose on Sat 08/04/14, Print     !! - Potential duplicate medications found. Please discuss with provider.        Mellody Drown, PA-C 07/29/14 [phone]

## 2014-07-28 NOTE — ED Notes (Signed)
Per pt sts that she has been having back pain, fever, hematuria, sts hx of kidney stone and UTI.

## 2014-07-28 NOTE — Discharge Instructions (Signed)
Call for a follow up appointment with a Family or Primary Care Provider.  Follow up with a Urologist. Return if Symptoms worsen.   Take medication as prescribed.  Do not operate heavy machinery or drink alcohol while taking Norco.  Emergency Department Resource Guide 1) Find a Doctor and Pay Out of Pocket Although you won't have to find out who is covered by your insurance plan, it is a good idea to ask around and get recommendations. You will then need to call the office and see if the doctor you have chosen will accept you as a new patient and what types of options they offer for patients who are self-pay. Some doctors offer discounts or will set up payment plans for their patients who do not have insurance, but you will need to ask so you aren't surprised when you get to your appointment.  2) Contact Your Local Health Department Not all health departments have doctors that can see patients for sick visits, but many do, so it is worth a call to see if yours does. If you don't know where your local health department is, you can check in your phone book. The CDC also has a tool to help you locate your state's health department, and many state websites also have listings of all of their local health departments.  3) Find a Walk-in Clinic If your illness is not likely to be very severe or complicated, you may want to try a walk in clinic. These are popping up all over the country in pharmacies, drugstores, and shopping centers. They're usually staffed by nurse practitioners or physician assistants that have been trained to treat common illnesses and complaints. They're usually fairly quick and inexpensive. However, if you have serious medical issues or chronic medical problems, these are probably not your best option.  No Primary Care Doctor: - Call Health Connect at  [phone] - they can help you locate a primary care doctor that  accepts your insurance, provides certain services, etc. - Physician  Referral Service- [phone]  Chronic Pain Problems: Organization         Address  Phone   Notes  [facility]  [phone] Patients need to be referred by their primary care doctor.   Medication Assistance: Organization         Address  Phone   Notes  [facility] Medication [facility] [address] [place] [place], [place] [address] [phone] --Must be a resident of [facility] -- Must have NO insurance coverage whatsoever (no Medicaid/ Medicare, etc.) -- The pt. MUST have a primary care doctor that directs their care regularly and follows them in the community   [facility]  [phone]   [employer]  [phone]    Agencies that provide inexpensive medical care: Organization         Address  Phone   Notes  [facility]  [phone]   [facility]    [phone]   [facility] [address] [place], [place] [address] [phone]   [facility] [address] [place] [place] [phone]   Planned Parenthood    [phone]   [facility]    [phone]   [facility] and [facility]  [address], [place] Phone:  [phone], Fax:  [phone] Hours of Operation:  9 am - 6 pm, M-F.  Also accepts Medicaid/Medicare and  self-pay.  [facility] for [place], [address], [place] Phone: [phone], Fax: [phone]. Hours of Operation:  8:30 am - 5:30 pm, M-F.  Also accepts Medicaid and self-pay.  [facility] [address] [place] Phone: [phone]   [place], [place], [place] [phone], Ext. [phone] Mondays & Thursdays: 7-9 AM.  First 15 patients are seen on a first come, first serve basis.    [place] Providers:  Organization         Address  Phone   Notes  [facility] [address], [place] [phone] Also accepts self-pay patients.  [facility] [address], [place]  [phone]   [place], [address], [place] [phone]   [facility] [address] [place] [phone]   Lucianne Lei [address], [place]   [phone] Only accepts [place] Access [place] patients after they have their name applied to their card.   Self-Pay (no insurance) in [employer]:  Organization         Address  Phone   Notes  Sickle Cell Patients, [facility] Internal Medicine [place] [phone]   [facility] Urgent Care [place] [phone]   [facility] [place]  [place], [place],  [phone]   [facility]/Dr. Osei-Bonsu  [address] [place] or [address], [place] [phone] Phone number for both [place] and [place] locations is the same.  Urgent Medical and [facility] [address] [place] [phone]   [facility] [address], [place] or [address] [phone]   [facility] [address] [place] [phone], phone; [phone], fax Sees patients 1st and 3rd Saturday of every month.  Must not qualify for public or private insurance (i.e. Medicaid, Medicare, [place] Health Choice, Veterans' Benefits)  Household income should be no more than 200% of the poverty level The clinic cannot treat you if you are pregnant or think you are pregnant  Sexually transmitted diseases are not treated at the clinic.    Dental Care: Organization         Address  Phone  Notes  [facility] Department of [facility] [phone] Accepts children up to age 40 who are enrolled  in [place] or [place]; pregnant women with a Medicaid card; and children who have applied for Medicaid or [place] Health Choice, but were declined, whose parents can pay a reduced fee at time of service.  [facility] Department of [facility]  [address], [place] [phone] Accepts children up to age 37 who are enrolled in [place] or [place]; pregnant women with a Medicaid card; and children who have applied for Medicaid or [place] Health Choice, but were declined, whose parents can pay a reduced fee at time of service.  [facility] PRO[facility]  [place] [phone] Patients are seen by appointment only. Walk-ins are not accepted. [place] will see patients 57 years of age and older. Monday - Tuesday (8am-5pm) Most Wednesdays (8:30-5pm) $  30 per visit, cash only  [facility] Adult [employer] PROGRAM  [address], [facility] [phone] Patients are seen by appointment only. Walk-ins are not accepted. [place] will see patients 62 years of age and older. One Wednesday Evening (Monthly: Volunteer Based).  $30 per visit, cash only  [place]ana Di­az  [phone] for adults; Children under age 14, call Graduate Pediatric Dentistry at [phone]. Children aged 37-14, please call [phone] to request a pediatric application.  Dental services are provided in all areas of dental care including fillings, crowns and bridges, complete and partial dentures, implants, gum treatment, root canals, and extractions. Preventive care is also provided. Treatment is provided to both adults and children. Patients are selected via a lottery and there is often a waiting list.   [facility] [address] [place]  [phone] [email or web address]   [facility] [address] [place], [place] [phone], Ext. [phone] Second and Fourth Thursday of each month, opens at  6:30 AM; Clinic ends at 9 AM.  Patients are seen on a first-come first-served basis, and a limited number are seen during each clinic.   [facility]  [address] Hillard Danker [place], [place] [phone]   Eligibility Requirements You must have lived in [place], [place], or [place] counties for at least the last three months.   You cannot be eligible for state or federal sponsored [employer], including [employer], [place], or [employer].   You generally cannot be eligible for healthcare insurance through your employer.    How to apply: Eligibility screenings are held every Tuesday and Wednesday afternoon from 1:00 pm until 4:00 pm. You do not need an appointment for the interview!  [facility] [address] [place], [place]   [place]  [place] Department  [place]  [phone]    Behavioral Health Resources in the Community: Intensive Outpatient Programs Organization         Address  Phone  Notes  [facility] [place]. [address] [place], [place] [phone]   [facility] Outpatient [address], [place], [place]   ADS: [facility] [address] [place], [employer]   [place] [address]  [place], [place] or [phone]   Substance Abuse Resources Organization         Address  Phone  Notes  [facility] Drug Services  [phone]   [place]  [phone]   [facility] [place]   Chinita Pester  [phone]   Residential & Outpatient Substance Abuse Program  [phone]   Psychological Services Organization         Address  Phone  Notes  [facility]  [place]  [phone]   [place] [address] [place] or [phone]    Mobile Crisis Teams Organization         Address  Phone  Notes  [facility], Mobile Crisis Care Unit  [phone]   [facility]  [address] [place], [place]   [place] [address] [place] [phone]    Self-Help/Support Groups Organization         Address  Phone  Notes  [facility] [place] - variety of support groups  [place] Call for more information  Narcotics Anonymous (NA), [facility] [address], [employer] [place]  2 meetings at this location   Special educational needs teacher         Address  Phone  Notes  [facility] Residential Treatment [place],    [place]  [phone]   [facility]  [address] [place] [address], [place], [place]   [place], [place] [phone] Admissions: 8am-3pm M-F  Incentives Substance [place] [address],    [place], [place] [phone]   [facility] [address] [place], [place], [place]   [facility] [address],  [place], [place]   [facility] - Intensive Outpatient [address]een [address], [place], [place]   [facility] ([place].,  [place], [place] [phone] or [phone]   [facility] ([facility]) [address], [place], [place] Accepts Medicaid  [facility] [place] [address],  [place] [phone] Substance Abuse/Addiction Treatment   [facility] Organization         Address  Phone  Notes  [facility]  [phone]   Domenic Schwab, PhD [address] Arlis Porta [place], [place]   [phone] or [phone]   [place], [place] [phone]   [facility]  [address] [place], [place] [phone] Insurance/Medicaid/sponsorship through [facility] and Families [address]                                    [place], [place] [phone] [place] [address], [place] [phone]    Dr. Adele Schilder  [phone]   [facility] Dept. 1) [address] [place] 2) [place] 3)  [place] [address], [place] [phone]  [phone]   [place] [phone] or [phone] (After Hours)

## 2014-07-29 NOTE — ED Provider Notes (Signed)
Medical screening examination/treatment/procedure(s) were performed by non-physician practitioner and as supervising physician I was immediately available for consultation/collaboration.   EKG Interpretation None        Kristen N Ward, DO 07/29/14 1922 

## 2014-07-30 LAB — URINE CULTURE: Colony Count: 40000

## 2015-09-17 IMAGING — CR DG HIP COMPLETE 2+V*R*
3 series · 3 of 3 positions shown · non-contrast
Comparison: None.

CLINICAL DATA: Pain post trauma

EXAM:
RIGHT HIP - COMPLETE 2+ VIEW

[t pelvis ap]
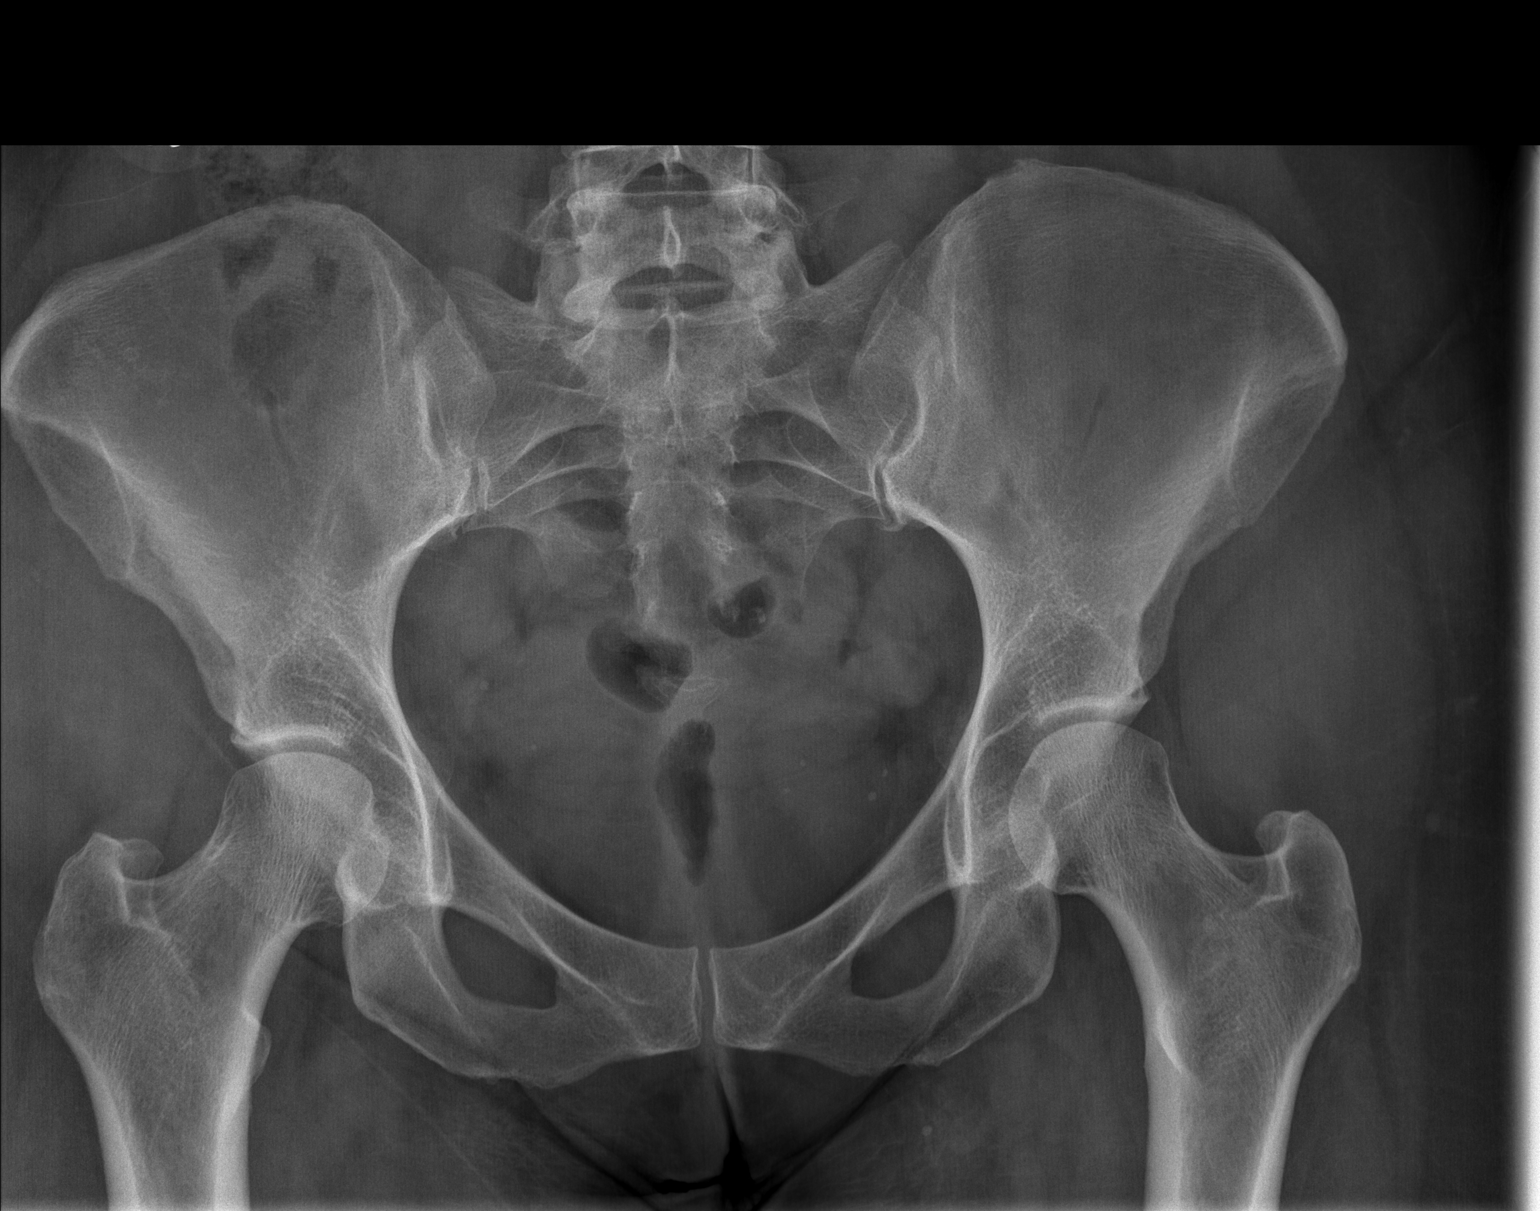

[t hip ap right]
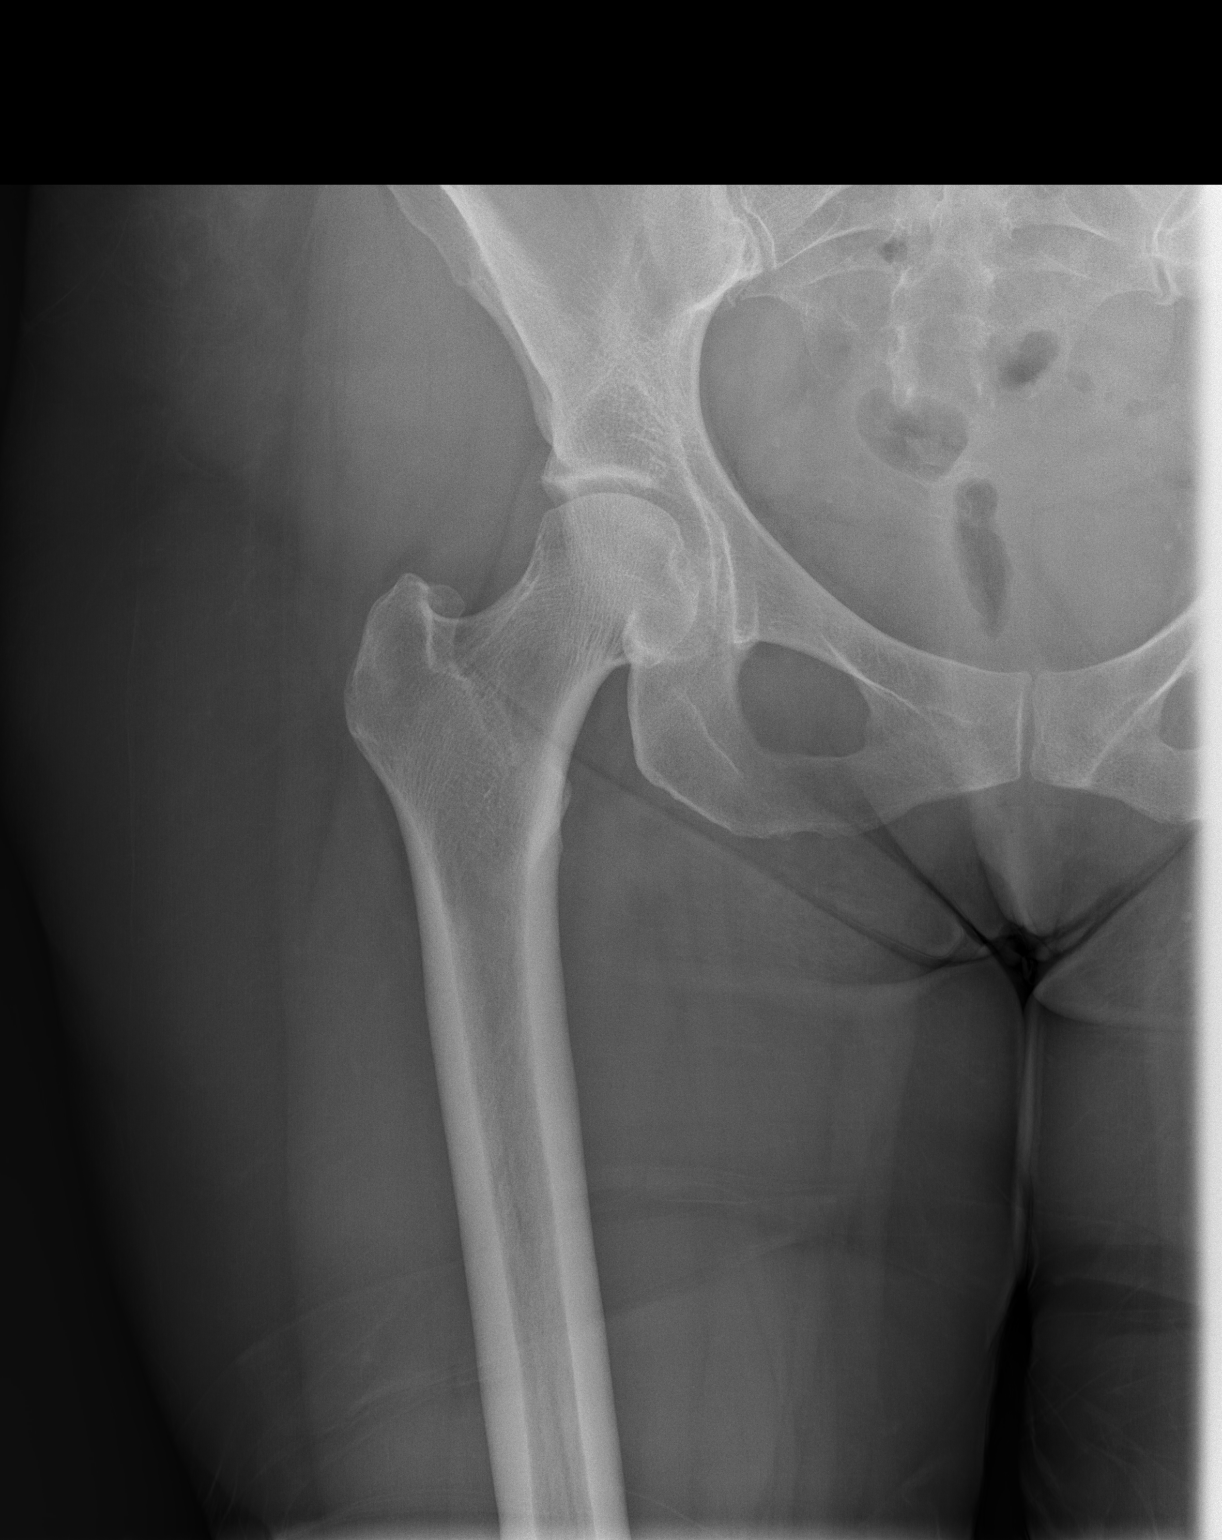

[t hip frog leg right]
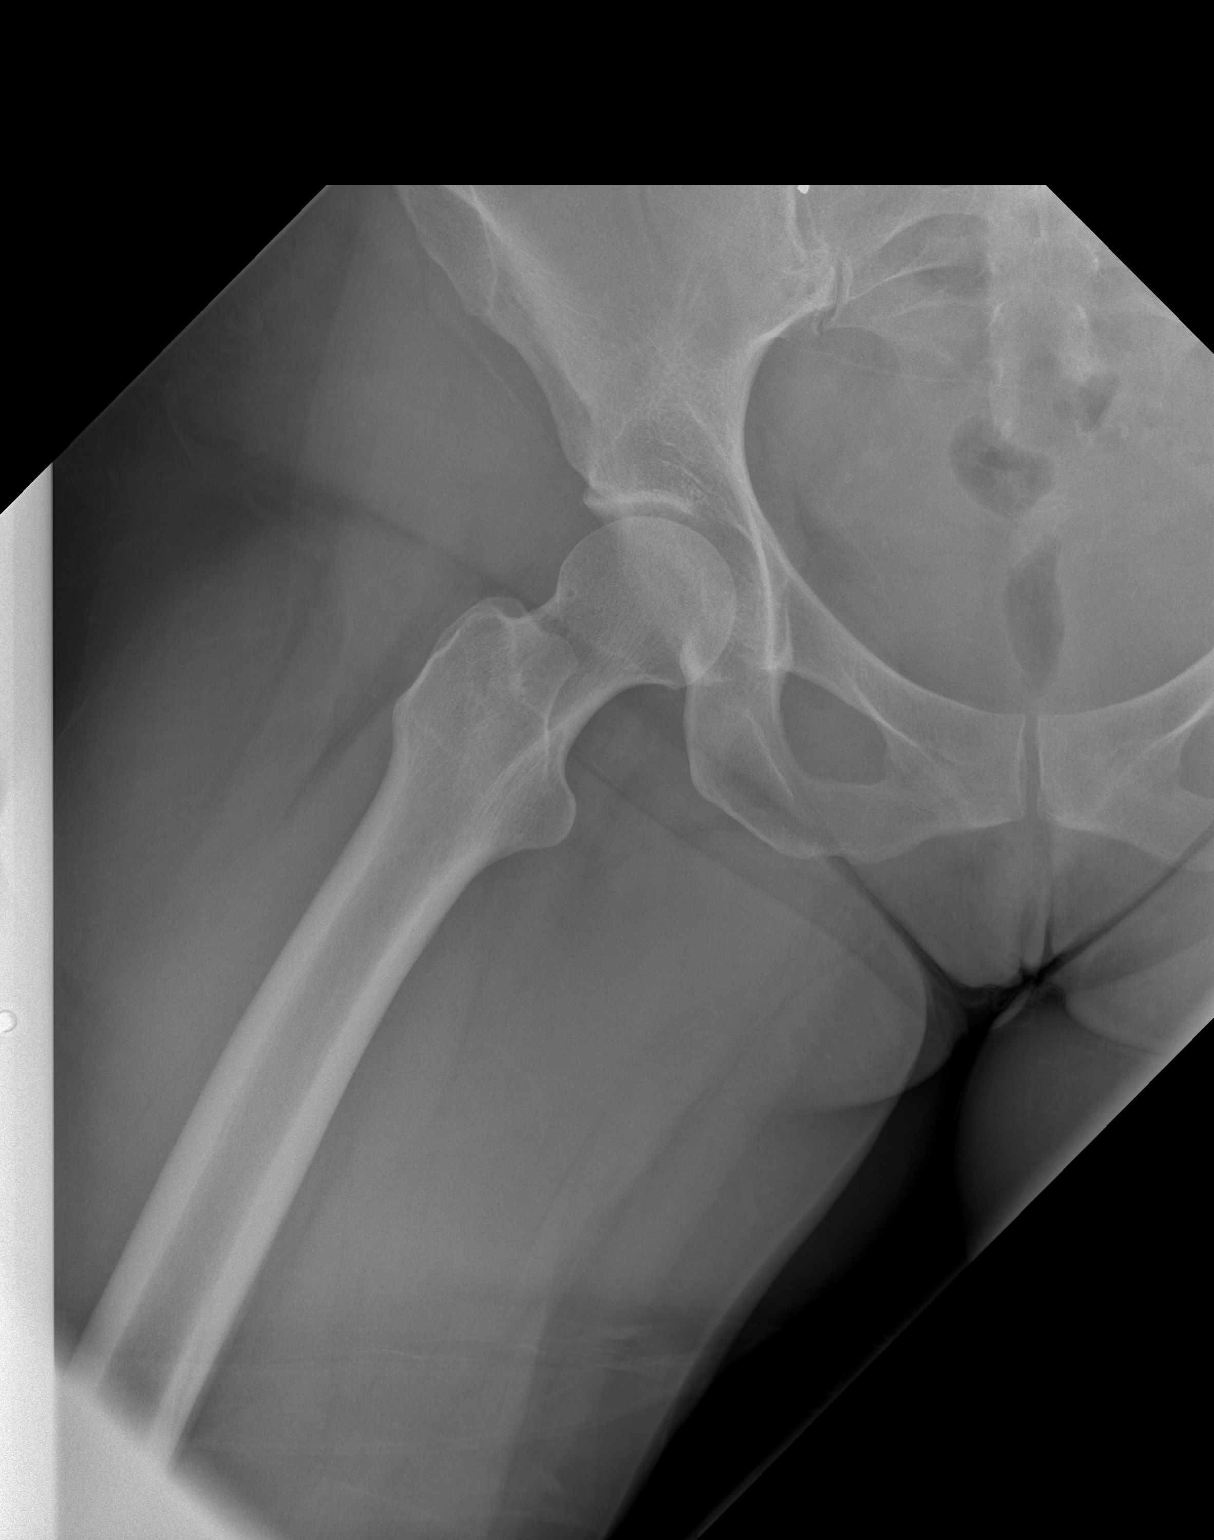

[3 of 3 positions shown; findings below may reference images not displayed]

FINDINGS: Frontal pelvis as well as frontal and lateral right hip images were
obtained. There is no fracture or dislocation. Joint spaces appear
intact. No erosive change.
IMPRESSION: No abnormality noted.
